# Patient Record
Sex: Female | Born: 1950 | Race: White | Hispanic: No | Smoking: Never smoker
Health system: Southern US, Community
[De-identification: ages and names within clinical notes are randomized; demographics above are authoritative.]

## PROBLEM LIST (undated history)

## (undated) DIAGNOSIS — F419 Anxiety disorder, unspecified: Secondary | ICD-10-CM

## (undated) DIAGNOSIS — F329 Major depressive disorder, single episode, unspecified: Secondary | ICD-10-CM

## (undated) DIAGNOSIS — I10 Essential (primary) hypertension: Secondary | ICD-10-CM

## (undated) DIAGNOSIS — F32A Depression, unspecified: Secondary | ICD-10-CM

## (undated) DIAGNOSIS — M1990 Unspecified osteoarthritis, unspecified site: Secondary | ICD-10-CM

## (undated) DIAGNOSIS — R569 Unspecified convulsions: Secondary | ICD-10-CM

## (undated) HISTORY — PX: WRIST GANGLION EXCISION: SUR520

## (undated) HISTORY — PX: TONSILLECTOMY: SUR1361

## (undated) HISTORY — PX: EYE SURGERY: SHX253

## (undated) HISTORY — PX: COLONOSCOPY: SHX174

---

## 1997-07-31 ENCOUNTER — Ambulatory Visit (HOSPITAL_COMMUNITY): Admission: RE | Admit: 1997-07-31 | Discharge: 1997-07-31 | Payer: Self-pay | Admitting: Obstetrics and Gynecology

## 1999-03-05 ENCOUNTER — Emergency Department (HOSPITAL_COMMUNITY): Admission: EM | Admit: 1999-03-05 | Discharge: 1999-03-05 | Payer: Self-pay | Admitting: Emergency Medicine

## 1999-03-05 ENCOUNTER — Encounter: Payer: Self-pay | Admitting: Emergency Medicine

## 1999-10-06 ENCOUNTER — Other Ambulatory Visit: Admission: RE | Admit: 1999-10-06 | Discharge: 1999-10-06 | Payer: Self-pay | Admitting: Obstetrics and Gynecology

## 1999-11-26 ENCOUNTER — Other Ambulatory Visit: Admission: RE | Admit: 1999-11-26 | Discharge: 1999-11-26 | Payer: Self-pay | Admitting: Obstetrics and Gynecology

## 2002-06-22 ENCOUNTER — Other Ambulatory Visit: Admission: RE | Admit: 2002-06-22 | Discharge: 2002-06-22 | Payer: Self-pay | Admitting: Obstetrics and Gynecology

## 2003-08-16 ENCOUNTER — Other Ambulatory Visit: Admission: RE | Admit: 2003-08-16 | Discharge: 2003-08-16 | Payer: Self-pay | Admitting: Obstetrics and Gynecology

## 2003-09-21 ENCOUNTER — Emergency Department (HOSPITAL_COMMUNITY): Admission: EM | Admit: 2003-09-21 | Discharge: 2003-09-21 | Payer: Self-pay | Admitting: Emergency Medicine

## 2006-04-08 ENCOUNTER — Ambulatory Visit: Payer: Self-pay | Admitting: Family Medicine

## 2006-04-29 ENCOUNTER — Ambulatory Visit: Payer: Self-pay | Admitting: Cardiology

## 2006-05-10 ENCOUNTER — Ambulatory Visit: Payer: Self-pay | Admitting: Cardiovascular Disease

## 2006-05-20 ENCOUNTER — Ambulatory Visit: Payer: Self-pay | Admitting: Cardiovascular Disease

## 2006-05-20 ENCOUNTER — Ambulatory Visit: Payer: Self-pay

## 2010-11-24 ENCOUNTER — Other Ambulatory Visit: Payer: Self-pay | Admitting: Neurological Surgery

## 2010-11-24 DIAGNOSIS — M479 Spondylosis, unspecified: Secondary | ICD-10-CM

## 2010-11-30 ENCOUNTER — Ambulatory Visit
Admission: RE | Admit: 2010-11-30 | Discharge: 2010-11-30 | Disposition: A | Discharge status: Home / Self Care | Payer: BC Managed Care – PPO | Source: Ambulatory Visit | Attending: Neurological Surgery | Admitting: Neurological Surgery

## 2010-11-30 DIAGNOSIS — M479 Spondylosis, unspecified: Secondary | ICD-10-CM

## 2011-05-03 ENCOUNTER — Other Ambulatory Visit (HOSPITAL_COMMUNITY): Payer: Self-pay | Admitting: Obstetrics and Gynecology

## 2011-05-03 DIAGNOSIS — R1900 Intra-abdominal and pelvic swelling, mass and lump, unspecified site: Secondary | ICD-10-CM

## 2011-05-05 ENCOUNTER — Ambulatory Visit (HOSPITAL_COMMUNITY): Payer: BC Managed Care – PPO

## 2011-05-07 ENCOUNTER — Ambulatory Visit
Admission: RE | Admit: 2011-05-07 | Discharge: 2011-05-07 | Disposition: A | Discharge status: Home / Self Care | Payer: No Typology Code available for payment source | Source: Ambulatory Visit | Attending: Obstetrics and Gynecology | Admitting: Obstetrics and Gynecology

## 2011-05-07 DIAGNOSIS — R1900 Intra-abdominal and pelvic swelling, mass and lump, unspecified site: Secondary | ICD-10-CM

## 2011-05-07 MED ORDER — IOHEXOL 300 MG/ML  SOLN
100.0000 mL | Freq: Once | INTRAMUSCULAR | Status: AC | PRN
  Administered 2011-05-07: 100 mL via INTRAVENOUS

## 2012-08-31 ENCOUNTER — Other Ambulatory Visit: Payer: Self-pay | Admitting: Family Medicine

## 2012-08-31 DIAGNOSIS — Z1231 Encounter for screening mammogram for malignant neoplasm of breast: Secondary | ICD-10-CM

## 2012-09-15 ENCOUNTER — Ambulatory Visit: Payer: Self-pay

## 2012-09-26 ENCOUNTER — Ambulatory Visit: Payer: Self-pay

## 2013-02-02 ENCOUNTER — Ambulatory Visit (INDEPENDENT_AMBULATORY_CARE_PROVIDER_SITE_OTHER): Payer: BC Managed Care – PPO

## 2013-02-02 DIAGNOSIS — R209 Unspecified disturbances of skin sensation: Secondary | ICD-10-CM

## 2013-02-02 NOTE — Procedures (Signed)
  HISTORY:  Danielle Ford is a 62 year old patient with a history of tingling sensations in the fifth and fourth fingers of the right hand, and difficulty with dexterity of the right hand. The patient is being evaluated for possible neuropathy.  NERVE CONDUCTION STUDIES:  Nerve conduction studies were performed on both upper extremities. The distal motor latencies and motor amplitudes for the median and ulnar nerves were within normal limits. The F wave latencies and nerve conduction velocities for these nerves were also normal. The sensory latencies for the median and ulnar nerves were normal.   EMG STUDIES:  EMG evaluation was not performed.  IMPRESSION:  Nerve conduction studies done on both upper extremities were within normal limits. No evidence of an ulnar neuropathy or carpal tunnel syndrome is seen on either side. If a cervical radiculopathy is clinically suspected, we recommend EMG evaluation of the affected extremity.  Marlan Palau MD 02/02/2013 1:04 PM  [facility] [address] [place], [place] [address]  Phone [phone] Fax [phone]

## 2013-04-04 ENCOUNTER — Other Ambulatory Visit: Payer: Self-pay | Admitting: Neurological Surgery

## 2013-04-04 DIAGNOSIS — M47812 Spondylosis without myelopathy or radiculopathy, cervical region: Secondary | ICD-10-CM

## 2013-04-04 DIAGNOSIS — G2589 Other specified extrapyramidal and movement disorders: Secondary | ICD-10-CM

## 2013-04-27 ENCOUNTER — Other Ambulatory Visit: Payer: Self-pay

## 2013-05-10 ENCOUNTER — Ambulatory Visit
Admission: RE | Admit: 2013-05-10 | Discharge: 2013-05-10 | Disposition: A | Discharge status: Home / Self Care | Payer: Self-pay | Source: Ambulatory Visit | Attending: Neurological Surgery | Admitting: Neurological Surgery

## 2013-05-10 ENCOUNTER — Ambulatory Visit
Admission: RE | Admit: 2013-05-10 | Discharge: 2013-05-10 | Disposition: A | Discharge status: Home / Self Care | Payer: BC Managed Care – PPO | Source: Ambulatory Visit | Attending: Neurological Surgery | Admitting: Neurological Surgery

## 2013-05-10 DIAGNOSIS — M47812 Spondylosis without myelopathy or radiculopathy, cervical region: Secondary | ICD-10-CM

## 2013-05-10 DIAGNOSIS — G2589 Other specified extrapyramidal and movement disorders: Secondary | ICD-10-CM

## 2013-05-10 MED ORDER — GADOBENATE DIMEGLUMINE 529 MG/ML IV SOLN
10.0000 mL | Freq: Once | INTRAVENOUS | Status: AC | PRN
  Administered 2013-05-10: 10 mL via INTRAVENOUS

## 2013-05-14 ENCOUNTER — Ambulatory Visit
Admission: RE | Admit: 2013-05-14 | Discharge: 2013-05-14 | Disposition: A | Discharge status: Home / Self Care | Payer: BC Managed Care – PPO | Source: Ambulatory Visit | Attending: Neurological Surgery | Admitting: Neurological Surgery

## 2013-05-14 ENCOUNTER — Other Ambulatory Visit: Payer: Self-pay | Admitting: Neurological Surgery

## 2013-05-14 DIAGNOSIS — I82401 Acute embolism and thrombosis of unspecified deep veins of right lower extremity: Secondary | ICD-10-CM

## 2014-04-12 ENCOUNTER — Other Ambulatory Visit: Payer: Self-pay | Admitting: Neurological Surgery

## 2014-04-12 DIAGNOSIS — M48061 Spinal stenosis, lumbar region without neurogenic claudication: Secondary | ICD-10-CM

## 2014-04-26 ENCOUNTER — Ambulatory Visit
Admission: RE | Admit: 2014-04-26 | Discharge: 2014-04-26 | Disposition: A | Discharge status: Home / Self Care | Payer: BC Managed Care – PPO | Source: Ambulatory Visit | Attending: Neurological Surgery | Admitting: Neurological Surgery

## 2014-04-26 DIAGNOSIS — M48061 Spinal stenosis, lumbar region without neurogenic claudication: Secondary | ICD-10-CM

## 2014-09-05 ENCOUNTER — Other Ambulatory Visit: Payer: Self-pay | Admitting: Neurological Surgery

## 2014-09-24 ENCOUNTER — Encounter (HOSPITAL_COMMUNITY)
Admission: RE | Admit: 2014-09-24 | Discharge: 2014-09-24 | Disposition: A | Discharge status: Home / Self Care | Payer: 59 | Source: Ambulatory Visit | Attending: Neurological Surgery | Admitting: Neurological Surgery

## 2014-09-24 ENCOUNTER — Encounter (HOSPITAL_COMMUNITY): Payer: Self-pay

## 2014-09-24 ENCOUNTER — Other Ambulatory Visit (HOSPITAL_COMMUNITY): Payer: Self-pay | Admitting: *Deleted

## 2014-09-24 DIAGNOSIS — Z01812 Encounter for preprocedural laboratory examination: Secondary | ICD-10-CM | POA: Insufficient documentation

## 2014-09-24 DIAGNOSIS — Z01810 Encounter for preprocedural cardiovascular examination: Secondary | ICD-10-CM | POA: Insufficient documentation

## 2014-09-24 HISTORY — DX: Major depressive disorder, single episode, unspecified: F32.9

## 2014-09-24 HISTORY — DX: Unspecified osteoarthritis, unspecified site: M19.90

## 2014-09-24 HISTORY — DX: Depression, unspecified: F32.A

## 2014-09-24 HISTORY — DX: Anxiety disorder, unspecified: F41.9

## 2014-09-24 HISTORY — DX: Unspecified convulsions: R56.9

## 2014-09-24 HISTORY — DX: Essential (primary) hypertension: I10

## 2014-09-24 LAB — CBC
HEMATOCRIT: 41 % (ref 36.0–46.0)
Hemoglobin: 13.5 g/dL (ref 12.0–15.0)
MCH: 32 pg (ref 26.0–34.0)
MCHC: 32.9 g/dL (ref 30.0–36.0)
MCV: 97.2 fL (ref 78.0–100.0)
PLATELETS: 234 10*3/uL (ref 150–400)
RBC: 4.22 MIL/uL (ref 3.87–5.11)
RDW: 13.4 % (ref 11.5–15.5)
WBC: 5.5 10*3/uL (ref 4.0–10.5)

## 2014-09-24 LAB — SURGICAL PCR SCREEN
MRSA, PCR: NEGATIVE
Staphylococcus aureus: NEGATIVE

## 2014-09-24 LAB — BASIC METABOLIC PANEL
Anion gap: 4 — ABNORMAL LOW (ref 5–15)
BUN: 16 mg/dL (ref 6–23)
CALCIUM: 10.2 mg/dL (ref 8.4–10.5)
CO2: 33 mmol/L — ABNORMAL HIGH (ref 19–32)
Chloride: 103 mmol/L (ref 96–112)
Creatinine, Ser: 0.87 mg/dL (ref 0.50–1.10)
GFR, EST AFRICAN AMERICAN: 80 mL/min — AB (ref >90)
GFR, EST NON AFRICAN AMERICAN: 69 mL/min — AB (ref >90)
GLUCOSE: 99 mg/dL (ref 70–99)
Potassium: 4.4 mmol/L (ref 3.5–5.1)
Sodium: 140 mmol/L (ref 135–145)

## 2014-09-24 NOTE — Progress Notes (Signed)
PCP is Tracey Harriesavid Bouska and patient stated her Cardiologist is Tonny BollmanMichael Cooper, however she had not seen him in approximately 5 years. Patient denied having any acute cardiac or pulmonary issues.   Patient informed Nurse that she last had a seizure in 2007, but has not had any thereafter.  Patient did not have any medications in EPIC, nor was patient aware of medication dosages. Pharmacy tech paged and came to PAT office and stated she would follow up with patients Pharmacy ([facility]).

## 2014-09-24 NOTE — Pre-Procedure Instructions (Signed)
Danielle GlassingKathy A Ford  09/24/2014   Your procedure is scheduled on:  Monday, October 07, 2014 at 7:30 AM.   Report to [facility] Entrance "A" Admitting Office at 5:30 AM.   Call this number if you have problems the morning of surgery: [phone]               Any questions prior to day of surgery, please call [phone] between 8 & 4 PM.   Remember:   Do not eat food or drink liquids after midnight Sunday, 10/06/14.   Take these medicines the morning of surgery with A SIP OF WATER: Lamotrigine (Lamictal), Fluoxetine, Allegra   Please stop taking any vitamins, herbal supplements, Advil, Motrin, Alleve, etc on Monday April 11th   Do not wear jewelry, make-up or nail polish.  Do not wear lotions, powders, or perfumes  Do not bring valuables to the hospital.  [facility] is not responsible for any belongings or valuables.               Contacts, dentures or bridgework may not be worn into surgery.  Leave suitcase in the car. After surgery it may be brought to your room.  For patients admitted to the hospital, discharge time is determined by your treatment team.               Special Instructions: See "Preparing for Surgery" Instruction sheet.    Please read over the following fact sheets that you were given: Pain Booklet, Coughing and Deep Breathing, MRSA Information and Surgical Site Infection Prevention

## 2014-09-24 NOTE — Pre-Procedure Instructions (Signed)
Ferd GlassingKathy A Brunker  09/24/2014   Your procedure is scheduled on:  Monday, October 07, 2014 at 7:30 AM.   Report [facility] Entrance "A" Admitting Office at 5:30 AM.   Call this number if you have problems the morning of surgery: [phone]               Any questions prior to day of surgery, please call [phone] between 8 & 4 PM.   Remember:   Do not eat food or drink liquids after midnight Sunday, 10/06/14.   Take these medicines the morning of surgery with A SIP OF WATER:    Do not wear jewelry, make-up or nail polish.  Do not wear lotions, powders, or perfumes  Do not bring valuables to the hospital.  [facility] is not responsible                  for any belongings or valuables.               Contacts, dentures or bridgework may not be worn into surgery.  Leave suitcase in the car. After surgery it may be brought to your room.  For patients admitted to the hospital, discharge time is determined by your                treatment team.               Special Instructions: See "Preparing for Surgery" Instruction sheet.    Please read over the following fact sheets that you were given: Pain Booklet, Coughing and Deep Breathing, MRSA Information and Surgical Site Infection Prevention

## 2014-09-27 ENCOUNTER — Other Ambulatory Visit (HOSPITAL_COMMUNITY): Payer: Self-pay

## 2014-10-06 MED ORDER — CEFAZOLIN SODIUM-DEXTROSE 2-3 GM-% IV SOLR
2.0000 g | INTRAVENOUS | Status: AC
  Administered 2014-10-07: 2 g via INTRAVENOUS
  Filled 2014-10-06: qty 50

## 2014-10-07 ENCOUNTER — Ambulatory Visit (HOSPITAL_COMMUNITY): Payer: 59 | Admitting: Anesthesiology

## 2014-10-07 ENCOUNTER — Encounter (HOSPITAL_COMMUNITY)
Admission: RE | Disposition: A | Discharge status: Home / Self Care | Payer: Self-pay | Source: Ambulatory Visit | Attending: Neurological Surgery

## 2014-10-07 ENCOUNTER — Ambulatory Visit (HOSPITAL_COMMUNITY): Payer: 59

## 2014-10-07 ENCOUNTER — Encounter (HOSPITAL_COMMUNITY): Payer: Self-pay

## 2014-10-07 ENCOUNTER — Observation Stay (HOSPITAL_COMMUNITY)
Admission: RE | Admit: 2014-10-07 | Discharge: 2014-10-08 | Disposition: A | Discharge status: Home / Self Care | Payer: 59 | Source: Ambulatory Visit | Attending: Neurological Surgery | Admitting: Neurological Surgery

## 2014-10-07 DIAGNOSIS — F329 Major depressive disorder, single episode, unspecified: Secondary | ICD-10-CM | POA: Insufficient documentation

## 2014-10-07 DIAGNOSIS — M4726 Other spondylosis with radiculopathy, lumbar region: Secondary | ICD-10-CM | POA: Diagnosis present

## 2014-10-07 DIAGNOSIS — M1990 Unspecified osteoarthritis, unspecified site: Secondary | ICD-10-CM | POA: Insufficient documentation

## 2014-10-07 DIAGNOSIS — I10 Essential (primary) hypertension: Secondary | ICD-10-CM | POA: Diagnosis not present

## 2014-10-07 DIAGNOSIS — M5416 Radiculopathy, lumbar region: Secondary | ICD-10-CM | POA: Diagnosis present

## 2014-10-07 DIAGNOSIS — Z419 Encounter for procedure for purposes other than remedying health state, unspecified: Secondary | ICD-10-CM

## 2014-10-07 HISTORY — PX: LUMBAR LAMINECTOMY/DECOMPRESSION MICRODISCECTOMY: SHX5026

## 2014-10-07 SURGERY — LUMBAR LAMINECTOMY/DECOMPRESSION MICRODISCECTOMY 1 LEVEL
Anesthesia: General | Site: Spine Lumbar

## 2014-10-07 MED ORDER — PROMETHAZINE HCL 25 MG/ML IJ SOLN: 6.2500 mg | INTRAMUSCULAR | Status: DC | PRN

## 2014-10-07 MED ORDER — LIDOCAINE HCL (CARDIAC) 20 MG/ML IV SOLN
INTRAVENOUS | Status: AC
  Filled 2014-10-07: qty 5

## 2014-10-07 MED ORDER — PHENYLEPHRINE 40 MCG/ML (10ML) SYRINGE FOR IV PUSH (FOR BLOOD PRESSURE SUPPORT)
PREFILLED_SYRINGE | INTRAVENOUS | Status: AC
  Filled 2014-10-07: qty 10

## 2014-10-07 MED ORDER — HYDROMORPHONE HCL 1 MG/ML IJ SOLN
0.2500 mg | INTRAMUSCULAR | Status: DC | PRN
  Administered 2014-10-07 (×2): 0.5 mg via INTRAVENOUS

## 2014-10-07 MED ORDER — MENTHOL 3 MG MT LOZG: 1.0000 | LOZENGE | OROMUCOSAL | Status: DC | PRN

## 2014-10-07 MED ORDER — LACTATED RINGERS IV SOLN
INTRAVENOUS | Status: DC | PRN
  Administered 2014-10-07 (×2): via INTRAVENOUS

## 2014-10-07 MED ORDER — PROPOFOL 10 MG/ML IV BOLUS
INTRAVENOUS | Status: AC
  Filled 2014-10-07: qty 20

## 2014-10-07 MED ORDER — ONDANSETRON HCL 4 MG/2ML IJ SOLN
INTRAMUSCULAR | Status: DC | PRN
  Administered 2014-10-07: 4 mg via INTRAVENOUS

## 2014-10-07 MED ORDER — ROCURONIUM BROMIDE 100 MG/10ML IV SOLN
INTRAVENOUS | Status: DC | PRN
  Administered 2014-10-07: 40 mg via INTRAVENOUS

## 2014-10-07 MED ORDER — DEXAMETHASONE SODIUM PHOSPHATE 10 MG/ML IJ SOLN
INTRAMUSCULAR | Status: DC | PRN
  Administered 2014-10-07: 10 mg via INTRAVENOUS

## 2014-10-07 MED ORDER — FLUOXETINE HCL 20 MG PO CAPS
20.0000 mg | ORAL_CAPSULE | Freq: Three times a day (TID) | ORAL | Status: DC
  Administered 2014-10-07 (×3): 20 mg via ORAL
  Filled 2014-10-07 (×6): qty 1

## 2014-10-07 MED ORDER — LAMOTRIGINE 200 MG PO TABS
200.0000 mg | ORAL_TABLET | Freq: Two times a day (BID) | ORAL | Status: DC
  Administered 2014-10-07: 200 mg via ORAL
  Filled 2014-10-07 (×4): qty 1

## 2014-10-07 MED ORDER — AMPHETAMINE-DEXTROAMPHETAMINE 10 MG PO TABS
30.0000 mg | ORAL_TABLET | Freq: Two times a day (BID) | ORAL | Status: DC
  Filled 2014-10-07: qty 3

## 2014-10-07 MED ORDER — DIPHENHYDRAMINE HCL 25 MG PO CAPS
25.0000 mg | ORAL_CAPSULE | Freq: Four times a day (QID) | ORAL | Status: DC | PRN
  Administered 2014-10-07: 25 mg via ORAL
  Filled 2014-10-07: qty 1

## 2014-10-07 MED ORDER — NEOSTIGMINE METHYLSULFATE 10 MG/10ML IV SOLN
INTRAVENOUS | Status: DC | PRN
  Administered 2014-10-07: 3 mg via INTRAVENOUS

## 2014-10-07 MED ORDER — SODIUM CHLORIDE 0.9 % IR SOLN
Status: DC | PRN
  Administered 2014-10-07: 500 mL

## 2014-10-07 MED ORDER — ALUM & MAG HYDROXIDE-SIMETH 200-200-20 MG/5ML PO SUSP: 30.0000 mL | Freq: Four times a day (QID) | ORAL | Status: DC | PRN

## 2014-10-07 MED ORDER — HEMOSTATIC AGENTS (NO CHARGE) OPTIME
TOPICAL | Status: DC | PRN
  Administered 2014-10-07: 1 via TOPICAL

## 2014-10-07 MED ORDER — OXYCODONE HCL 5 MG PO TABS
ORAL_TABLET | ORAL | Status: AC
  Filled 2014-10-07: qty 1

## 2014-10-07 MED ORDER — MIDAZOLAM HCL 2 MG/2ML IJ SOLN
INTRAMUSCULAR | Status: AC
  Filled 2014-10-07: qty 2

## 2014-10-07 MED ORDER — DOCUSATE SODIUM 100 MG PO CAPS
100.0000 mg | ORAL_CAPSULE | Freq: Two times a day (BID) | ORAL | Status: DC
  Administered 2014-10-07: 100 mg via ORAL
  Filled 2014-10-07 (×2): qty 1

## 2014-10-07 MED ORDER — SUCCINYLCHOLINE CHLORIDE 20 MG/ML IJ SOLN
INTRAMUSCULAR | Status: AC
Start: 2014-10-07 — End: 2014-10-07
  Filled 2014-10-07: qty 1

## 2014-10-07 MED ORDER — OXYCODONE HCL 5 MG PO TABS
5.0000 mg | ORAL_TABLET | Freq: Three times a day (TID) | ORAL | Status: DC
Start: 2014-10-07 — End: 2014-10-08
  Filled 2014-10-07 (×3): qty 1

## 2014-10-07 MED ORDER — BUPIVACAINE HCL (PF) 0.5 % IJ SOLN
INTRAMUSCULAR | Status: DC | PRN
  Administered 2014-10-07: 20 mL
  Administered 2014-10-07: 5 mL

## 2014-10-07 MED ORDER — LIDOCAINE HCL (CARDIAC) 20 MG/ML IV SOLN
INTRAVENOUS | Status: DC | PRN
  Administered 2014-10-07: 40 mg via INTRATRACHEAL
  Administered 2014-10-07: 60 mg via INTRAVENOUS

## 2014-10-07 MED ORDER — ACETAMINOPHEN 650 MG RE SUPP: 650.0000 mg | RECTAL | Status: DC | PRN

## 2014-10-07 MED ORDER — DEXAMETHASONE SODIUM PHOSPHATE 10 MG/ML IJ SOLN
INTRAMUSCULAR | Status: AC
  Filled 2014-10-07: qty 1

## 2014-10-07 MED ORDER — LIDOCAINE-EPINEPHRINE 1 %-1:100000 IJ SOLN
INTRAMUSCULAR | Status: DC | PRN
  Administered 2014-10-07: 5 mL

## 2014-10-07 MED ORDER — ARTIFICIAL TEARS OP OINT
TOPICAL_OINTMENT | OPHTHALMIC | Status: AC
  Filled 2014-10-07: qty 3.5

## 2014-10-07 MED ORDER — LIDOCAINE HCL (CARDIAC) 20 MG/ML IV SOLN
INTRAVENOUS | Status: AC
Start: 2014-10-07 — End: 2014-10-07
  Filled 2014-10-07: qty 5

## 2014-10-07 MED ORDER — NEOSTIGMINE METHYLSULFATE 10 MG/10ML IV SOLN
INTRAVENOUS | Status: AC
  Filled 2014-10-07: qty 1

## 2014-10-07 MED ORDER — FENTANYL CITRATE (PF) 100 MCG/2ML IJ SOLN
INTRAMUSCULAR | Status: DC | PRN
  Administered 2014-10-07: 50 ug via INTRAVENOUS
  Administered 2014-10-07: 100 ug via INTRAVENOUS

## 2014-10-07 MED ORDER — DEXTROSE 5 % IV SOLN: 500.0000 mg | Freq: Four times a day (QID) | INTRAVENOUS | Status: DC | PRN

## 2014-10-07 MED ORDER — OXYCODONE HCL 5 MG PO TABS
5.0000 mg | ORAL_TABLET | Freq: Once | ORAL | Status: AC | PRN
  Administered 2014-10-07: 5 mg via ORAL

## 2014-10-07 MED ORDER — FENTANYL CITRATE (PF) 250 MCG/5ML IJ SOLN
INTRAMUSCULAR | Status: AC
  Filled 2014-10-07: qty 5

## 2014-10-07 MED ORDER — 0.9 % SODIUM CHLORIDE (POUR BTL) OPTIME
TOPICAL | Status: DC | PRN
  Administered 2014-10-07: 1000 mL

## 2014-10-07 MED ORDER — SODIUM CHLORIDE 0.9 % IV SOLN: 250.0000 mL | INTRAVENOUS | Status: DC

## 2014-10-07 MED ORDER — HYDROMORPHONE HCL 1 MG/ML IJ SOLN
0.5000 mg | INTRAMUSCULAR | Status: DC | PRN
  Administered 2014-10-07 (×2): 1 mg via INTRAVENOUS
  Filled 2014-10-07 (×3): qty 1

## 2014-10-07 MED ORDER — EPHEDRINE SULFATE 50 MG/ML IJ SOLN
INTRAMUSCULAR | Status: AC
Start: 2014-10-07 — End: 2014-10-07
  Filled 2014-10-07: qty 1

## 2014-10-07 MED ORDER — POLYETHYLENE GLYCOL 3350 17 G PO PACK
17.0000 g | PACK | Freq: Every day | ORAL | Status: DC | PRN
  Filled 2014-10-07: qty 1

## 2014-10-07 MED ORDER — METHOCARBAMOL 500 MG PO TABS
500.0000 mg | ORAL_TABLET | Freq: Four times a day (QID) | ORAL | Status: DC | PRN
  Administered 2014-10-08: 500 mg via ORAL
  Filled 2014-10-07: qty 1

## 2014-10-07 MED ORDER — SODIUM CHLORIDE 0.9 % IJ SOLN
INTRAMUSCULAR | Status: AC
  Filled 2014-10-07: qty 10

## 2014-10-07 MED ORDER — AMPHETAMINE-DEXTROAMPHETAMINE 30 MG PO TABS: 30.0000 mg | ORAL_TABLET | Freq: Two times a day (BID) | ORAL | Status: DC

## 2014-10-07 MED ORDER — ACETAMINOPHEN 325 MG PO TABS: 650.0000 mg | ORAL_TABLET | ORAL | Status: DC | PRN

## 2014-10-07 MED ORDER — OXYCODONE-ACETAMINOPHEN 5-325 MG PO TABS: 1.0000 | ORAL_TABLET | ORAL | Status: DC | PRN

## 2014-10-07 MED ORDER — GLYCOPYRROLATE 0.2 MG/ML IJ SOLN
INTRAMUSCULAR | Status: AC
  Filled 2014-10-07: qty 1

## 2014-10-07 MED ORDER — MIDAZOLAM HCL 5 MG/5ML IJ SOLN
INTRAMUSCULAR | Status: DC | PRN
  Administered 2014-10-07: 2 mg via INTRAVENOUS

## 2014-10-07 MED ORDER — HYDROMORPHONE HCL 1 MG/ML IJ SOLN
INTRAMUSCULAR | Status: AC
  Filled 2014-10-07: qty 1

## 2014-10-07 MED ORDER — LORATADINE 10 MG PO TABS
10.0000 mg | ORAL_TABLET | Freq: Every day | ORAL | Status: DC
  Filled 2014-10-07: qty 1

## 2014-10-07 MED ORDER — BISACODYL 10 MG RE SUPP: 10.0000 mg | Freq: Every day | RECTAL | Status: DC | PRN

## 2014-10-07 MED ORDER — GLYCOPYRROLATE 0.2 MG/ML IJ SOLN
INTRAMUSCULAR | Status: DC | PRN
  Administered 2014-10-07: 0.4 mg via INTRAVENOUS

## 2014-10-07 MED ORDER — SODIUM CHLORIDE 0.9 % IJ SOLN: 3.0000 mL | INTRAMUSCULAR | Status: DC | PRN

## 2014-10-07 MED ORDER — GLYCOPYRROLATE 0.2 MG/ML IJ SOLN
INTRAMUSCULAR | Status: AC
  Filled 2014-10-07: qty 2

## 2014-10-07 MED ORDER — SODIUM CHLORIDE 0.9 % IJ SOLN
3.0000 mL | Freq: Two times a day (BID) | INTRAMUSCULAR | Status: DC
  Administered 2014-10-07: 3 mL via INTRAVENOUS

## 2014-10-07 MED ORDER — ARTIFICIAL TEARS OP OINT
TOPICAL_OINTMENT | OPHTHALMIC | Status: DC | PRN
  Administered 2014-10-07: 1 via OPHTHALMIC

## 2014-10-07 MED ORDER — TRAZODONE HCL 100 MG PO TABS
100.0000 mg | ORAL_TABLET | Freq: Every day | ORAL | Status: DC
  Administered 2014-10-07: 100 mg via ORAL
  Filled 2014-10-07 (×2): qty 1

## 2014-10-07 MED ORDER — KETOROLAC TROMETHAMINE 15 MG/ML IJ SOLN
15.0000 mg | Freq: Four times a day (QID) | INTRAMUSCULAR | Status: DC
  Administered 2014-10-07 – 2014-10-08 (×4): 15 mg via INTRAVENOUS
  Filled 2014-10-07 (×5): qty 1

## 2014-10-07 MED ORDER — SENNA 8.6 MG PO TABS
1.0000 | ORAL_TABLET | Freq: Two times a day (BID) | ORAL | Status: DC
  Administered 2014-10-07: 8.6 mg via ORAL
  Filled 2014-10-07 (×3): qty 1

## 2014-10-07 MED ORDER — PHENOL 1.4 % MT LIQD: 1.0000 | OROMUCOSAL | Status: DC | PRN

## 2014-10-07 MED ORDER — ONDANSETRON HCL 4 MG/2ML IJ SOLN
INTRAMUSCULAR | Status: AC
  Filled 2014-10-07: qty 2

## 2014-10-07 MED ORDER — OXYCODONE HCL 5 MG PO TABS
5.0000 mg | ORAL_TABLET | ORAL | Status: DC | PRN
  Administered 2014-10-08 (×2): 10 mg via ORAL
  Filled 2014-10-07 (×2): qty 2

## 2014-10-07 MED ORDER — MONTELUKAST SODIUM 10 MG PO TABS
10.0000 mg | ORAL_TABLET | Freq: Every day | ORAL | Status: DC
  Filled 2014-10-07: qty 1

## 2014-10-07 MED ORDER — ONDANSETRON HCL 4 MG/2ML IJ SOLN: 4.0000 mg | INTRAMUSCULAR | Status: DC | PRN

## 2014-10-07 MED ORDER — OXYCODONE HCL 5 MG/5ML PO SOLN: 5.0000 mg | Freq: Once | ORAL | Status: AC | PRN

## 2014-10-07 MED ORDER — PROPOFOL 10 MG/ML IV BOLUS
INTRAVENOUS | Status: DC | PRN
  Administered 2014-10-07: 150 mg via INTRAVENOUS

## 2014-10-07 MED ORDER — LISINOPRIL 5 MG PO TABS
5.0000 mg | ORAL_TABLET | Freq: Every day | ORAL | Status: DC
  Administered 2014-10-07: 5 mg via ORAL
  Filled 2014-10-07 (×2): qty 1

## 2014-10-07 MED ORDER — THROMBIN 5000 UNITS EX SOLR
CUTANEOUS | Status: DC | PRN
  Administered 2014-10-07 (×2): 5000 [IU] via TOPICAL

## 2014-10-07 MED ORDER — ROCURONIUM BROMIDE 50 MG/5ML IV SOLN
INTRAVENOUS | Status: AC
  Filled 2014-10-07: qty 1

## 2014-10-07 SURGICAL SUPPLY — 57 items
ADH SKN CLS LQ APL DERMABOND (GAUZE/BANDAGES/DRESSINGS) ×1
BAG DECANTER FOR FLEXI CONT (MISCELLANEOUS) ×3 IMPLANT
BLADE CLIPPER SURG (BLADE) IMPLANT
BUR ACORN 6.0 (BURR) IMPLANT
BUR ACORN 6.0MM (BURR)
BUR MATCHSTICK NEURO 3.0 LAGG (BURR) ×3 IMPLANT
CANISTER SUCT 3000ML PPV (MISCELLANEOUS) ×3 IMPLANT
CONT SPEC 4OZ CLIKSEAL STRL BL (MISCELLANEOUS) ×3 IMPLANT
DECANTER SPIKE VIAL GLASS SM (MISCELLANEOUS) ×3 IMPLANT
DERMABOND ADHESIVE PROPEN (GAUZE/BANDAGES/DRESSINGS) ×2
DERMABOND ADVANCED .7 DNX6 (GAUZE/BANDAGES/DRESSINGS) IMPLANT
DRAPE LAPAROTOMY T 102X78X121 (DRAPES) ×3 IMPLANT
DRAPE MICROSCOPE LEICA (MISCELLANEOUS) IMPLANT
DRAPE POUCH INSTRU U-SHP 10X18 (DRAPES) ×3 IMPLANT
DRAPE PROXIMA HALF (DRAPES) IMPLANT
DURAPREP 26ML APPLICATOR (WOUND CARE) ×3 IMPLANT
ELECT REM PT RETURN 9FT ADLT (ELECTROSURGICAL) ×3
ELECTRODE REM PT RTRN 9FT ADLT (ELECTROSURGICAL) ×1 IMPLANT
GAUZE SPONGE 4X4 12PLY STRL (GAUZE/BANDAGES/DRESSINGS) ×1 IMPLANT
GAUZE SPONGE 4X4 16PLY XRAY LF (GAUZE/BANDAGES/DRESSINGS) IMPLANT
GLOVE BIO SURGEON STRL SZ8 (GLOVE) ×2 IMPLANT
GLOVE BIO SURGEON STRL SZ8.5 (GLOVE) ×2 IMPLANT
GLOVE BIOGEL PI IND STRL 7.5 (GLOVE) IMPLANT
GLOVE BIOGEL PI IND STRL 8.5 (GLOVE) ×1 IMPLANT
GLOVE BIOGEL PI INDICATOR 7.5 (GLOVE) ×2
GLOVE BIOGEL PI INDICATOR 8.5 (GLOVE) ×2
GLOVE ECLIPSE 8.5 STRL (GLOVE) ×3 IMPLANT
GLOVE EXAM NITRILE LRG STRL (GLOVE) IMPLANT
GLOVE EXAM NITRILE MD LF STRL (GLOVE) IMPLANT
GLOVE EXAM NITRILE XL STR (GLOVE) IMPLANT
GLOVE EXAM NITRILE XS STR PU (GLOVE) IMPLANT
GLOVE SURG SS PI 7.0 STRL IVOR (GLOVE) ×4 IMPLANT
GOWN STRL REUS W/ TWL LRG LVL3 (GOWN DISPOSABLE) IMPLANT
GOWN STRL REUS W/ TWL XL LVL3 (GOWN DISPOSABLE) IMPLANT
GOWN STRL REUS W/TWL 2XL LVL3 (GOWN DISPOSABLE) ×3 IMPLANT
GOWN STRL REUS W/TWL LRG LVL3 (GOWN DISPOSABLE)
GOWN STRL REUS W/TWL XL LVL3 (GOWN DISPOSABLE) ×6
KIT BASIN OR (CUSTOM PROCEDURE TRAY) ×3 IMPLANT
KIT ROOM TURNOVER OR (KITS) ×3 IMPLANT
LIQUID BAND (GAUZE/BANDAGES/DRESSINGS) ×1 IMPLANT
NDL SPNL 20GX3.5 QUINCKE YW (NEEDLE) IMPLANT
NEEDLE HYPO 22GX1.5 SAFETY (NEEDLE) ×3 IMPLANT
NEEDLE SPNL 20GX3.5 QUINCKE YW (NEEDLE) ×3 IMPLANT
NS IRRIG 1000ML POUR BTL (IV SOLUTION) ×3 IMPLANT
PACK LAMINECTOMY NEURO (CUSTOM PROCEDURE TRAY) ×3 IMPLANT
PAD ARMBOARD 7.5X6 YLW CONV (MISCELLANEOUS) ×13 IMPLANT
PATTIES SURGICAL .5 X1 (DISPOSABLE) ×1 IMPLANT
RUBBERBAND STERILE (MISCELLANEOUS) IMPLANT
SPONGE SURGIFOAM ABS GEL SZ50 (HEMOSTASIS) ×3 IMPLANT
SUT VIC AB 1 CT1 18XBRD ANBCTR (SUTURE) ×1 IMPLANT
SUT VIC AB 1 CT1 8-18 (SUTURE) ×3
SUT VIC AB 2-0 CP2 18 (SUTURE) ×3 IMPLANT
SUT VIC AB 3-0 SH 8-18 (SUTURE) ×3 IMPLANT
SYR 20ML ECCENTRIC (SYRINGE) ×3 IMPLANT
TOWEL OR 17X24 6PK STRL BLUE (TOWEL DISPOSABLE) ×3 IMPLANT
TOWEL OR 17X26 10 PK STRL BLUE (TOWEL DISPOSABLE) ×3 IMPLANT
WATER STERILE IRR 1000ML POUR (IV SOLUTION) ×3 IMPLANT

## 2014-10-07 NOTE — Evaluation (Signed)
Physical Therapy Evaluation Patient Details Name: Danielle GlassingKathy A Clippard MRN: [id number] DOB: February 01, 1951 Today's Date: 10/07/2014   History of Present Illness  Patient is a 64 y/o female s/p L3-4 Laminectomy/Foraminotomy. PMH of HTN, depression, seizures, anxiety and arthritis.  Clinical Impression  Patient presents with generalized weakness and balance deficits impacting mobility. Tolerated ambulation with mild drifting most likely secondary to pain medications. Education provided on back precautions. Pt will have 24/7 S at home initially from friends/roommates. Would benefit from skilled PT to improve transfers, gait, balance and mobility so pt can maximize independence and return to PLOF.    Follow Up Recommendations No PT follow up;Supervision - Intermittent    Equipment Recommendations  None recommended by PT    Recommendations for Other Services       Precautions / Restrictions Precautions Precautions: Back Precaution Booklet Issued: Yes (comment) Precaution Comments: Reviewed precautions and handout. Restrictions Weight Bearing Restrictions: No      Mobility  Bed Mobility Overal bed mobility: Needs Assistance Bed Mobility: Rolling;Sidelying to Sit;Sit to Sidelying Rolling: Modified independent (Device/Increase time) Sidelying to sit: Modified independent (Device/Increase time);HOB elevated     Sit to sidelying: Modified independent (Device/Increase time);HOB elevated General bed mobility comments: Use of rail for support. Cues for log roll technique.  Transfers Overall transfer level: Modified independent Equipment used: None Transfers: Sit to/from Stand Sit to Stand: Modified independent (Device/Increase time)         General transfer comment: Stood from EOB x1, from toilet x1.   Ambulation/Gait Ambulation/Gait assistance: Supervision Ambulation Distance (Feet): 200 Feet Assistive device: None Gait Pattern/deviations: Step-through pattern;Decreased dorsiflexion -  right;Decreased dorsiflexion - left;Drifts right/left   Gait velocity interpretation: <1.8 ft/sec, indicative of risk for recurrent falls General Gait Details: Pt with mostly steady gait- mild drifting noted most likely due to pain medications and pt just waking up. No LOB. Decreased foot clearance Bil. Pt not wearing shoes (reports increased stability with shoes donned).  Stairs Stairs: Yes Stairs assistance: Supervision Stair Management: Alternating pattern;One rail Right Number of Stairs: 12 General stair comments: Supervision for safety. Despite cues for technique, pt utilizing step through pattern.  Wheelchair Mobility    Modified Rankin (Stroke Patients Only)       Balance Overall balance assessment: Needs assistance Sitting-balance support: Feet supported;No upper extremity supported Sitting balance-Leahy Scale: Good     Standing balance support: During functional activity Standing balance-Leahy Scale: Good                               Pertinent Vitals/Pain Pain Assessment: No/denies pain    Home Living Family/patient expects to be discharged to:: Private residence Living Arrangements: Non-relatives/Friends Available Help at Discharge: Friend(s);Available 24 hours/day Type of Home: House Home Access: Stairs to enter   [employer] Corporation[employer]trance Stairs-Number of Steps: 1 Home Layout: Multi-level Home Equipment: None      Prior Function Level of Independence: Independent         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     Hand Dominance        Extremity/Trunk Assessment   Upper Extremity Assessment: Defer to OT evaluation           Lower Extremity Assessment: RLE deficits/detail;LLE deficits/detail;Generalized weakness RLE Deficits / Details: Decreased DF noted during gait  LLE Deficits / Details: Decreased DF noted during gait  Cervical / Trunk Assessment: Normal  Communication   Communication: No difficulties  Cognition Arousal/Alertness:  Awake/alert Behavior During  Therapy: WFL for tasks assessed/performed Overall Cognitive Status: Within Functional Limits for tasks assessed                      General Comments General comments (skin integrity, edema, etc.): Instructed pt to change positions every 45 minutes.     Exercises        Assessment/Plan    PT Assessment Patient needs continued PT services  PT Diagnosis Difficulty walking   PT Problem List Decreased strength;Decreased balance;Decreased mobility;Impaired sensation;Decreased knowledge of precautions  PT Treatment Interventions Balance training;Gait training;Stair training;Patient/family education;Functional mobility training;Therapeutic activities;Therapeutic exercise   PT Goals (Current goals can be found in the Care Plan section) Acute Rehab PT Goals Patient Stated Goal: to return to independence PT Goal Formulation: With patient Time For Goal Achievement: 10/21/14 Potential to Achieve Goals: Good    Frequency Min 5X/week   Barriers to discharge        Co-evaluation               End of Session Equipment Utilized During Treatment: Gait belt Activity Tolerance: Patient tolerated treatment well Patient left: in bed;with call bell/phone within reach;with family/visitor present Nurse Communication: Mobility status    Functional Assessment Tool Used: Clinical judgment Functional Limitation: Mobility: Walking and moving around Mobility: Walking and Moving Around Current Status [phone]): At least 1 percent but less than 20 percent impaired, limited or restricted Mobility: Walking and Moving Around Goal Status [phone]): At least 1 percent but less than 20 percent impaired, limited or restricted    Time: 1525-1545 PT Time Calculation (min) (ACUTE ONLY): 20 min   Charges:   PT Evaluation $Initial PT Evaluation Tier I: 1 Procedure     PT G Codes:   PT G-Codes **NOT FOR INPATIENT CLASS** Functional Assessment Tool Used: Clinical  judgment Functional Limitation: Mobility: Walking and moving around Mobility: Walking and Moving Around Current Status ([id number]): At least 1 percent but less than 20 percent impaired, limited or restricted Mobility: Walking and Moving Around Goal Status [phone]): At least 1 percent but less than 20 percent impaired, limited or restricted    Alvie Heidelberg A 10/07/2014, 4:13 PM Alvie Heidelberg, PT, DPT [phone]

## 2014-10-07 NOTE — Anesthesia Postprocedure Evaluation (Signed)
  Anesthesia Post-op Note  Patient: Danielle Ford  Procedure(s) Performed: Procedure(s): Lumbar Three-Four  Laminectomy/Foraminotomy (N/A)  Patient Location: PACU  Anesthesia Type:General  Level of Consciousness: awake and alert   Airway and Oxygen Therapy: Patient Spontanous Breathing  Post-op Pain: mild  Post-op Assessment: Post-op Vital signs reviewed  Post-op Vital Signs: stable  Last Vitals:  Filed Vitals:   10/07/14 1058  BP: 116/56  Pulse: 71  Temp: 36.8 C  Resp: 16    Complications: No apparent anesthesia complications

## 2014-10-07 NOTE — Op Note (Signed)
Date of surgery: 10/07/2014 Preoperative diagnosis: Lumbar spondylosis, lumbar stenosis, lumbar radiculopathy, spondylolisthesis L3-L4 Postoperative diagnosis: Same Procedure: Bilateral laminotomies and foraminotomies L3-L4 with decompression of L3 and L4 nerve roots. Surgeon: Barnett AbuHenry Jann Milkovich First assistant: Tressie StalkerJeffrey Jenkins M.D. Anesthesia: Gen. endotracheal Indications: Patient is a 64 year old individual who's had significant and progressive back and leg pain the pain seems to be worse in the left leg than the right leg. An MRI at the end of November demonstrates the presence of severe spondylitic stenosis at L3-L4 with grade 1 spondylolisthesis. There are also milder changes at L4-5 and also at L5-S1. She is failed all manner of conservative effort and surgery has been recommended to decompress L3-L4.  The patient was brought to the operating room supine on a stretcher. After the smooth induction of general endotracheal anesthesia, she was turned prone. The back was prepped with alcohol and DuraPrep and draped in a sterile fashion. Midline incision was made in the lumbar dorsal fascia was opened on either side of L3-L4 which was localized positively with the radiograph. In the interlaminar space at L3-4 was cleared. Self-retaining retractor was placed into the wound. Dissection of the lamina was then performed and removal of the inferior margin lamina of L3 out to the medial wall the facet performing a partial facetectomy was performed. The old ligament was noted to be very thickened and redundant and this was lifted and removed. The common dural tube underlying this was examined. The dissection was carried out laterally inferiorly to decompress the L4 nerve root which had substantial subarticular stenosis. This was elevated with 2 mm Kerrison punch and nerve root was carefully protected  as it was decompressed. this was done bilaterally. Then in the superior aspect the L3 nerve root path was sounded and  there was noted to be substantial redundant thickened ligamentous material along with the superior aspect of the foot articular process of L4. This was resected partially and the foramen for the L4 nerve root was decompressed in this fashion. In the end the common dural tube both the L3 nerve roots in both the L4 nerve roots were well decompressed. The dura was inspected and noted to be intact. The underlying disc was noted to have a slight central bulge but the ligament itself was intact. With this hemostasis was achieved in the soft tissues, the retractor was removed, and then the wound was closed with #1 lumbar dorsal fascia. 2-0 Vicryl was used in the subcutaneous tissues. 20 mL of half percent Marcaine was injected into the paraspinous fascia. Subcuticular skin was closed with 3-0 Vicryl. Blood loss for the entire procedure was estimated at less than 50 mL.

## 2014-10-07 NOTE — Addendum Note (Signed)
Addendum  created 10/07/14 1256 by Lovie CholJennifer K Caspian Deleonardis, CRNA   Modules edited: Anesthesia Attestations

## 2014-10-07 NOTE — H&P (Signed)
Danielle Ford is an 64 y.o. female.   Chief Complaint: back an bilateral leg pain HPI: Patient is a 64 yo white female who has had progressively worsening back pain and leg dysfunction for several years. 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 Am Danielle LatchMri from December of 2015 demonstrates this best. Surgical decompression had been contemplated for some time and is now to be done with bilateral laminotomies and foraminotomies at L 3-4. It is also noted that she has mild stenosis at L4-5 and to an even lesser extent at L5S1.  Past Medical History  Diagnosis Date  . Hypertension   . Seizures   . Depression   . Anxiety   . Arthritis     Past Surgical History  Procedure Laterality Date  . Wrist ganglion excision Left   . Tonsillectomy      And adnoids  . Colonoscopy    . Eye surgery Bilateral     cataract surgery    No family history on file. Social History:  reports that she has never smoked. She does not have any smokeless tobacco history on file. She reports that she drinks about 1.8 oz of alcohol per week. She reports that she does not use illicit drugs.  Allergies: No Known Allergies  No prescriptions prior to admission    No results found for this or any previous visit (from the past 48 hour(s)). No results found.  Review of Systems  Constitutional: Negative.   HENT: Negative.   Eyes: Negative.   Respiratory: Negative.   Cardiovascular: Negative.   Gastrointestinal: Negative.   Genitourinary: Negative.   Musculoskeletal: Positive for back pain.  Skin: Negative.   Neurological: Positive for tingling and focal weakness.  Endo/Heme/Allergies: Negative.   Psychiatric/Behavioral: Negative.     There were no vitals taken for this visit. Physical Exam  Constitutional: She is  oriented to person, place, and time. She appears well-developed and well-nourished.  HENT:  Head: Normocephalic and atraumatic.  Eyes: Conjunctivae are normal. Pupils are equal, round, and reactive to light.  Neck: Normal range of motion. Neck supple.  Cardiovascular: Normal rate and regular rhythm.   Respiratory: Effort normal and breath sounds normal.  GI: Soft. Bowel sounds are normal.  Musculoskeletal:  Positive SLR at 30 degrees bilat  Neurological: She is alert and oriented to person, place, and time.  Mild tib ant weakness bilateraally  Skin: Skin is warm and dry.  Psychiatric: She has a normal mood and affect. Her behavior is normal. Judgment and thought content normal.     Assessment/Plan Spondylosis and Stenosis at L3-4.  Abad Manard J 10/07/2014, 2:26 AM

## 2014-10-07 NOTE — Transfer of Care (Signed)
Immediate Anesthesia Transfer of Care Note  Patient: Danielle Ford  Procedure(s) Performed: Procedure(s): Lumbar Three-Four  Laminectomy/Foraminotomy (N/A)  Patient Location: PACU  Anesthesia Type:General  Level of Consciousness: awake, oriented and patient cooperative  Airway & Oxygen Therapy: Patient Spontanous Breathing and Patient connected to nasal cannula oxygen  Post-op Assessment: Report given to RN and Post -op Vital signs reviewed and stable  Post vital signs: Reviewed  Last Vitals:  Filed Vitals:   10/07/14 0609  BP: 106/52  Pulse: 62  Temp: 36.5 C  Resp: 20    Complications: No apparent anesthesia complications

## 2014-10-07 NOTE — Anesthesia Preprocedure Evaluation (Addendum)
Anesthesia Evaluation  Patient identified by MRN, date of birth, ID band Patient awake    Reviewed: Allergy & Precautions, NPO status , Patient's Chart, lab work & pertinent test results  History of Anesthesia Complications Negative for: history of anesthetic complications  Airway Mallampati: II  TM Distance: >3 FB Neck ROM: Full  Mouth opening: Limited Mouth Opening Comment: cerv disease Dental  (+) Teeth Intact, Caps, Dental Advisory Given,    Pulmonary neg pulmonary ROS,  breath sounds clear to auscultation        Cardiovascular hypertension, Pt. on medications Rhythm:Regular Rate:Normal     Neuro/Psych Seizures -, Well Controlled,  PSYCHIATRIC DISORDERS Depression    GI/Hepatic negative GI ROS, Neg liver ROS,   Endo/Other  negative endocrine ROS  Renal/GU negative Renal ROS     Musculoskeletal  (+) Arthritis -,   Abdominal   Peds  Hematology   Anesthesia Other Findings C = capped teeth  Reproductive/Obstetrics negative OB ROS                           Anesthesia Physical Anesthesia Plan  ASA: II  Anesthesia Plan: General   Post-op Pain Management:    Induction: Intravenous  Airway Management Planned: Oral ETT  Additional Equipment:   Intra-op Plan:   Post-operative Plan: Extubation in OR  Informed Consent: I have reviewed the patients History and Physical, chart, labs and discussed the procedure including the risks, benefits and alternatives for the proposed anesthesia with the patient or authorized representative who has indicated his/her understanding and acceptance.   Dental advisory given  Plan Discussed with: CRNA and Surgeon  Anesthesia Plan Comments:        Anesthesia Quick Evaluation

## 2014-10-07 NOTE — Anesthesia Procedure Notes (Signed)
Procedure Name: Intubation Date/Time: 10/07/2014 7:41 AM Performed by: Lovie CholOCK, Avik Leoni K Pre-anesthesia Checklist: Patient identified, Emergency Drugs available, Suction available, Patient being monitored and Timeout performed Patient Re-evaluated:Patient Re-evaluated prior to inductionOxygen Delivery Method: Circle system utilized Intubation Type: IV induction and Cricoid Pressure applied Ventilation: Mask ventilation without difficulty Laryngoscope Size: Miller and 2 Grade View: Grade I Tube type: Oral Tube size: 7.0 mm Number of attempts: 1 Airway Equipment and Method: Stylet Placement Confirmation: ETT inserted through vocal cords under direct vision,  positive ETCO2,  CO2 detector and breath sounds checked- equal and bilateral Secured at: 21 cm Tube secured with: Tape Dental Injury: Teeth and Oropharynx as per pre-operative assessment

## 2014-10-08 DIAGNOSIS — M4726 Other spondylosis with radiculopathy, lumbar region: Secondary | ICD-10-CM | POA: Diagnosis not present

## 2014-10-08 MED ORDER — OXYCODONE HCL 5 MG PO TABS: 5.0000 mg | ORAL_TABLET | Freq: Three times a day (TID) | ORAL | Status: DC

## 2014-10-08 MED ORDER — TAPENTADOL HCL 75 MG PO TABS: 75.0000 mg | ORAL_TABLET | Freq: Four times a day (QID) | ORAL | Status: DC | PRN

## 2014-10-08 MED ORDER — DIAZEPAM 5 MG PO TABS: 5.0000 mg | ORAL_TABLET | Freq: Four times a day (QID) | ORAL | Status: AC | PRN

## 2014-10-08 NOTE — Discharge Summary (Signed)
Physician Discharge Summary  Patient ID: Danielle Ford MRN: [id number] DOB/AGE: June 24, 1950 64 y.o.  Admit date: 10/07/2014 Discharge date: 10/08/2014  Admission Diagnoses: Lumbar spondylosis and stenosis L3-L4, lumbar radiculopathy  Discharge Diagnoses: Lumbar spondylosis and stenosis L3-L4, lumbar radiculopathy left greater than right, neurogenic claudication  Active Problems:   Lumbar radiculopathy   Discharged Condition: good  Hospital Course: Patient was admitted to undergo surgical decompression which she tolerated well. She is ambulatory. She is noting less numbness and pain in her left leg. Her incision is clean and dry.  Consults: None  Significant Diagnostic Studies: None  Treatments: surgery: I lateral decompression L3-L4 with laminotomies and foraminotomies.  Discharge Exam: Blood pressure 115/60, pulse 68, temperature 98.3 F (36.8 C), temperature source Oral, resp. rate 16, weight 57.607 kg (127 lb), SpO2 92 %. Incision is clean and dry, motor function is intact. Station and gait are intact.  Disposition: 01-Home or Self Care  Discharge Instructions    Call MD for:  redness, tenderness, or signs of infection (pain, swelling, redness, odor or green/yellow discharge around incision site)    Complete by:  As directed      Call MD for:  severe uncontrolled pain    Complete by:  As directed      Call MD for:  temperature >100.4    Complete by:  As directed      Diet - low sodium heart healthy    Complete by:  As directed      Discharge instructions    Complete by:  As directed   Okay to shower. Do not apply salves or appointments to incision. No heavy lifting with the upper extremities greater than 15 pounds. May resume driving when not requiring pain medication and patient feels comfortable with doing so.     Increase activity slowly    Complete by:  As directed             Medication List    TAKE these medications        amphetamine-dextroamphetamine 30  MG tablet  Commonly known as:  ADDERALL  Take 30 mg by mouth 2 (two) times daily.     b complex vitamins tablet  Take 1 tablet by mouth daily.     Bilberry 80 MG Caps  Take 1 capsule by mouth daily.     CALCIUM + D PO  Take 1 tablet by mouth daily.     diazepam 5 MG tablet  Commonly known as:  VALIUM  Take 1 tablet (5 mg total) by mouth every 6 (six) hours as needed for muscle spasms.     fexofenadine 180 MG tablet  Commonly known as:  ALLEGRA  Take 180 mg by mouth daily.     FLUoxetine 20 MG capsule  Commonly known as:  PROZAC  Take 20 mg by mouth 3 (three) times daily.     GLUCOSAMINE MSM COMPLEX PO  Take 1,500 mg by mouth daily.     lamoTRIgine 200 MG tablet  Commonly known as:  LAMICTAL  Take 200 mg by mouth 2 (two) times daily.     lisinopril 5 MG tablet  Commonly known as:  PRINIVIL,ZESTRIL  Take 5 mg by mouth daily.     montelukast 10 MG tablet  Commonly known as:  SINGULAIR  Take 10 mg by mouth at bedtime.     oxycodone 5 MG capsule  Commonly known as:  OXY-IR  Take 5 mg by mouth 3 (three) times daily.  Tapentadol HCl 75 MG Tabs  Take 1 tablet (75 mg total) by mouth every 6 (six) hours as needed (moderate pain).     traZODone 100 MG tablet  Commonly known as:  DESYREL  Take 100-200 mg by mouth at bedtime.     vitamin A 47829 UNIT capsule  Take 10,000 Units by mouth 2 (two) times daily.     Vitamin D3 5000 UNITS Tabs  Take 1 tablet by mouth 2 (two) times daily.     vitamin E 400 UNIT capsule  Take 400 Units by mouth 3 (three) times daily.         SignedStefani Dama 10/08/2014, 4:16 PM

## 2014-10-08 NOTE — Progress Notes (Signed)
Spoke to PT and patient.  Pt doing well with all adls and all education completed with PT.  No OT needs at this time. Danielle Ford, [place] [phone]

## 2014-10-08 NOTE — Progress Notes (Signed)
Patient ID: [name], female   DOB: [birth year], 64 y.o.   MRN: [id number] Motor function intact , doing well.

## 2014-10-08 NOTE — Progress Notes (Signed)
Physical Therapy Treatment Patient Details Name: Danielle Ford MRN: [id number] DOB: 1951/01/13 Today's Date: 10/08/2014    History of Present Illness Patient is a 64 y/o female s/p L3-4 Laminectomy/Foraminotomy. PMH of HTN, depression, seizures, anxiety and arthritis.    PT Comments    Patient mobilizing well without device. Patient educated on car transfers, safe ambulation, mobility expectations, positional changes, and pain management. Patient able to verablize precautions and demonstrate compliance throughout session. No further acute PT needs at this time. Will sign off.    Follow Up Recommendations  No PT follow up;Supervision - Intermittent     Equipment Recommendations  None recommended by PT    Recommendations for Other Services       Precautions / Restrictions Precautions Precautions: Back Precaution Booklet Issued: Yes (comment) Precaution Comments: Reviewed precautions and handout. Restrictions Weight Bearing Restrictions: No    Mobility  Bed Mobility Overal bed mobility: Needs Assistance Bed Mobility: Rolling;Sidelying to Sit;Sit to Sidelying Rolling: Modified independent (Device/Increase time) Sidelying to sit: Modified independent (Device/Increase time);HOB elevated     Sit to sidelying: Modified independent (Device/Increase time);HOB elevated General bed mobility comments: Use of rail for support. Cues for log roll technique.  Transfers Overall transfer level: Modified independent Equipment used: None   Sit to Stand: Modified independent (Device/Increase time)         General transfer comment: Stood from EOB x1, from toilet x1.   Ambulation/Gait Ambulation/Gait assistance: Modified independent (Device/Increase time) Ambulation Distance (Feet): 220 Feet Assistive device: None Gait Pattern/deviations: Step-through pattern;Decreased dorsiflexion - right;Decreased dorsiflexion - left;Drifts right/left   Gait velocity interpretation: <1.8 ft/sec,  indicative of risk for recurrent falls General Gait Details: Mild instability noted with ambulation, no significant deviations, no noted LOB   Stairs         General stair comments: Supervision for safety. Despite cues for technique, pt utilizing step through pattern.  Wheelchair Mobility    Modified Rankin (Stroke Patients Only)       Balance   Sitting-balance support: Feet supported Sitting balance-Leahy Scale: Good     Standing balance support: During functional activity Standing balance-Leahy Scale: Good                      Cognition Arousal/Alertness: Awake/alert Behavior During Therapy: WFL for tasks assessed/performed Overall Cognitive Status: Within Functional Limits for tasks assessed                      Exercises      General Comments General comments (skin integrity, edema, etc.): Patient educated on car transfers, safe ambulation, mobility expectations, positional changes, and pain management. Patient able to verablize precautions and demonstrate compliance throughout session.      Pertinent Vitals/Pain Pain Assessment: 0-10 Pain Score: 8  Pain Location: back Pain Descriptors / Indicators: Tender Pain Intervention(s): Limited activity within patient's tolerance;Monitored during session;Repositioned    Home Living                      Prior Function            PT Goals (current goals can now be found in the care plan section) Acute Rehab PT Goals Patient Stated Goal: to return to independence PT Goal Formulation: With patient Time For Goal Achievement: 10/21/14 Potential to Achieve Goals: Good Progress towards PT goals: Goals met/education completed, patient discharged from PT    Frequency  Min 5X/week    PT Plan Current plan remains  appropriate    Co-evaluation             End of Session Equipment Utilized During Treatment: Gait belt Activity Tolerance: Patient tolerated treatment well Patient left:  in bed;with call bell/phone within reach;with family/visitor present (sitting EOB)     Time: 5825-1898 PT Time Calculation (min) (ACUTE ONLY): 18 min  Charges:  $Self Care/Home Management: 8-22                    G Codes:  Functional Assessment Tool Used: Clinical judgment Functional Limitation: Mobility: Walking and moving around Mobility: Walking and Moving Around Current Status (M2103): At least 1 percent but less than 20 percent impaired, limited or restricted Mobility: Walking and Moving Around Goal Status [phone]): At least 1 percent but less than 20 percent impaired, limited or restricted Mobility: Walking and Moving Around Discharge Status [phone]): At least 1 percent but less than 20 percent impaired, limited or restricted   Duncan Dull 10/08/2014, 8:46 AM Alben Deeds, PT DPT  [phone]

## 2014-10-08 NOTE — Progress Notes (Signed)
Pt doing well. Pt given D/C instructions with Rx's, verbal understanding was provided. Pt's incision is clean and dry with no sign of infection. Pt's IV was removed prior to D/C. Pt D/C'd home via wheelchair @ 1345 per MD order. Pt is stable @ D/C and has no other needs at this time. Rema FendtAshley Cameron Schwinn, RN

## 2014-10-08 NOTE — Progress Notes (Signed)
UR completed 

## 2014-10-08 NOTE — Progress Notes (Signed)
Physical Therapy Discharge Patient Details Name: Danielle Ford MRN: [id number] DOB: 1950-07-13 Today's Date: 10/08/2014 Time: 4627-0350 PT Time Calculation (min) (ACUTE ONLY): 18 min  Patient discharged from PT services secondary to goals met and no further PT needs identified.  Please see latest therapy progress note for current level of functioning and progress toward goals.    Progress and discharge plan discussed with patient and/or caregiver: Patient/Caregiver agrees with plan  GP Functional Assessment Tool Used: Clinical judgment Functional Limitation: Mobility: Walking and moving around Mobility: Walking and Moving Around Current Status (K9381): At least 1 percent but less than 20 percent impaired, limited or restricted Mobility: Walking and Moving Around Goal Status [phone]): At least 1 percent but less than 20 percent impaired, limited or restricted Mobility: Walking and Moving Around Discharge Status [phone]): At least 1 percent but less than 20 percent impaired, limited or restricted   Rolla Etienne, PT DPT  [phone]  10/08/2014, 8:47 AM

## 2014-10-09 ENCOUNTER — Encounter (HOSPITAL_COMMUNITY): Payer: Self-pay | Admitting: Neurological Surgery

## 2015-04-04 ENCOUNTER — Other Ambulatory Visit: Payer: Self-pay | Admitting: Physician Assistant

## 2015-04-04 NOTE — H&P (Signed)
Olegario MessierKathy comes in with a new issue.  Left ring finger.  Locking going on since December.  Getting worse rather than better.  Previous x-rays showing some Heberden nodes, degenerative changes.   Remaining history is reviewed, updated and included in the chart.  Of note, I have gone over her evaluation for her back.  She is going to be retiring in February has operative intervention planned on her lumbar spine then.     EXAMINATION: General exam is outlined and included in the chart.  Specifically, obvious triggering A1 pulley, ring finger, left hand.  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  No other deformities.  Neurovascularly intact.    IMPRESSION: Triggering A1 pulley, left ring finger.    PLAN: We discussed options.  We are going to try an injection today.  If this fails I have gone over what is involved for A1 pulley release.  She will call and let me know.    PROCEDURE NOTE: The patient's clinical condition is marked by substantial pain and/or significant functional disability.  Other conservative therapy has not provided relief, is contraindicated, or not appropriate.  There is a reasonable likelihood that injection will significantly improve the patient's pain and/or functional disability. After appropriate consent and under sterile technique A1 pulley injected with Depo-Medrol/Marcaine, left ring finger.  Tolerated this well.       Loreta Aveaniel F. Murphy, M.D.  Addendum: Pain has returned following injection and patient would like to proceed with surgical intervention.  We will proceed with A-1 pulley release left ring finger.  Risks, benefits and possible complications reviewed.  Rehab and recovery time reviewed.  All questions answered.  Paperwork complete.

## 2015-04-07 ENCOUNTER — Ambulatory Visit (HOSPITAL_BASED_OUTPATIENT_CLINIC_OR_DEPARTMENT_OTHER): Payer: 59 | Admitting: Anesthesiology

## 2015-04-07 ENCOUNTER — Encounter (HOSPITAL_BASED_OUTPATIENT_CLINIC_OR_DEPARTMENT_OTHER): Payer: Self-pay

## 2015-04-07 ENCOUNTER — Encounter (HOSPITAL_BASED_OUTPATIENT_CLINIC_OR_DEPARTMENT_OTHER)
Admission: RE | Disposition: A | Discharge status: Home / Self Care | Payer: Self-pay | Source: Ambulatory Visit | Attending: Orthopedic Surgery

## 2015-04-07 ENCOUNTER — Ambulatory Visit (HOSPITAL_BASED_OUTPATIENT_CLINIC_OR_DEPARTMENT_OTHER)
Admission: RE | Admit: 2015-04-07 | Discharge: 2015-04-07 | Disposition: A | Discharge status: Home / Self Care | Payer: 59 | Source: Ambulatory Visit | Attending: Orthopedic Surgery | Admitting: Orthopedic Surgery

## 2015-04-07 DIAGNOSIS — I10 Essential (primary) hypertension: Secondary | ICD-10-CM | POA: Diagnosis not present

## 2015-04-07 DIAGNOSIS — Z79899 Other long term (current) drug therapy: Secondary | ICD-10-CM | POA: Diagnosis not present

## 2015-04-07 DIAGNOSIS — F329 Major depressive disorder, single episode, unspecified: Secondary | ICD-10-CM | POA: Insufficient documentation

## 2015-04-07 DIAGNOSIS — M65342 Trigger finger, left ring finger: Secondary | ICD-10-CM | POA: Diagnosis present

## 2015-04-07 DIAGNOSIS — M1990 Unspecified osteoarthritis, unspecified site: Secondary | ICD-10-CM | POA: Diagnosis not present

## 2015-04-07 DIAGNOSIS — F419 Anxiety disorder, unspecified: Secondary | ICD-10-CM | POA: Insufficient documentation

## 2015-04-07 HISTORY — PX: TRIGGER FINGER RELEASE: SHX641

## 2015-04-07 SURGERY — RELEASE, A1 PULLEY, FOR TRIGGER FINGER
Anesthesia: General | Site: Hand | Laterality: Left

## 2015-04-07 MED ORDER — ONDANSETRON HCL 4 MG/2ML IJ SOLN
INTRAMUSCULAR | Status: DC | PRN
  Administered 2015-04-07: 4 mg via INTRAVENOUS

## 2015-04-07 MED ORDER — HYDROMORPHONE HCL 1 MG/ML IJ SOLN: 0.2500 mg | INTRAMUSCULAR | Status: DC | PRN

## 2015-04-07 MED ORDER — KETOROLAC TROMETHAMINE 30 MG/ML IJ SOLN: 30.0000 mg | Freq: Once | INTRAMUSCULAR | Status: DC

## 2015-04-07 MED ORDER — OXYCODONE-ACETAMINOPHEN 5-325 MG PO TABS: 1.0000 | ORAL_TABLET | ORAL | Status: AC | PRN

## 2015-04-07 MED ORDER — FENTANYL CITRATE (PF) 100 MCG/2ML IJ SOLN
INTRAMUSCULAR | Status: AC
  Filled 2015-04-07: qty 4

## 2015-04-07 MED ORDER — BUPIVACAINE HCL (PF) 0.25 % IJ SOLN
INTRAMUSCULAR | Status: AC
  Filled 2015-04-07: qty 30

## 2015-04-07 MED ORDER — DEXAMETHASONE SODIUM PHOSPHATE 4 MG/ML IJ SOLN
INTRAMUSCULAR | Status: DC | PRN
  Administered 2015-04-07: 10 mg via INTRAVENOUS

## 2015-04-07 MED ORDER — ONDANSETRON HCL 4 MG PO TABS: 4.0000 mg | ORAL_TABLET | Freq: Three times a day (TID) | ORAL | Status: AC | PRN

## 2015-04-07 MED ORDER — BUPIVACAINE HCL (PF) 0.5 % IJ SOLN
INTRAMUSCULAR | Status: DC | PRN
Start: 2015-04-07 — End: 2015-04-07
  Administered 2015-04-07: 10 mL

## 2015-04-07 MED ORDER — LIDOCAINE HCL (CARDIAC) 20 MG/ML IV SOLN
INTRAVENOUS | Status: AC
  Filled 2015-04-07: qty 5

## 2015-04-07 MED ORDER — FENTANYL CITRATE (PF) 100 MCG/2ML IJ SOLN
INTRAMUSCULAR | Status: DC | PRN
  Administered 2015-04-07: 100 ug via INTRAVENOUS

## 2015-04-07 MED ORDER — LIDOCAINE HCL (CARDIAC) 20 MG/ML IV SOLN
INTRAVENOUS | Status: DC | PRN
  Administered 2015-04-07: 30 mg via INTRAVENOUS

## 2015-04-07 MED ORDER — SUCCINYLCHOLINE CHLORIDE 20 MG/ML IJ SOLN
INTRAMUSCULAR | Status: AC
  Filled 2015-04-07: qty 1

## 2015-04-07 MED ORDER — DEXAMETHASONE SODIUM PHOSPHATE 10 MG/ML IJ SOLN
INTRAMUSCULAR | Status: AC
  Filled 2015-04-07: qty 1

## 2015-04-07 MED ORDER — LACTATED RINGERS IV SOLN
INTRAVENOUS | Status: DC
  Administered 2015-04-07: 09:00:00 via INTRAVENOUS

## 2015-04-07 MED ORDER — OXYCODONE HCL 5 MG/5ML PO SOLN: 5.0000 mg | Freq: Once | ORAL | Status: DC | PRN

## 2015-04-07 MED ORDER — MIDAZOLAM HCL 5 MG/5ML IJ SOLN
INTRAMUSCULAR | Status: DC | PRN
  Administered 2015-04-07: 2 mg via INTRAVENOUS

## 2015-04-07 MED ORDER — PROPOFOL 10 MG/ML IV BOLUS
INTRAVENOUS | Status: DC | PRN
  Administered 2015-04-07: 200 mg via INTRAVENOUS

## 2015-04-07 MED ORDER — PROMETHAZINE HCL 25 MG/ML IJ SOLN
6.2500 mg | INTRAMUSCULAR | Status: DC | PRN
Start: 2015-04-07 — End: 2015-04-07

## 2015-04-07 MED ORDER — BUPIVACAINE HCL (PF) 0.5 % IJ SOLN
INTRAMUSCULAR | Status: AC
  Filled 2015-04-07: qty 30

## 2015-04-07 MED ORDER — ONDANSETRON HCL 4 MG/2ML IJ SOLN
INTRAMUSCULAR | Status: AC
  Filled 2015-04-07: qty 2

## 2015-04-07 MED ORDER — CEFAZOLIN SODIUM-DEXTROSE 2-3 GM-% IV SOLR
2.0000 g | INTRAVENOUS | Status: AC
Start: 2015-04-07 — End: 2015-04-07
  Administered 2015-04-07: 2 g via INTRAVENOUS

## 2015-04-07 MED ORDER — OXYCODONE HCL 5 MG PO TABS: 5.0000 mg | ORAL_TABLET | Freq: Once | ORAL | Status: DC | PRN

## 2015-04-07 MED ORDER — CHLORHEXIDINE GLUCONATE 4 % EX LIQD: 60.0000 mL | Freq: Once | CUTANEOUS | Status: DC

## 2015-04-07 MED ORDER — MIDAZOLAM HCL 2 MG/2ML IJ SOLN
INTRAMUSCULAR | Status: AC
  Filled 2015-04-07: qty 4

## 2015-04-07 SURGICAL SUPPLY — 45 items
BANDAGE ELASTIC 3 VELCRO ST LF (GAUZE/BANDAGES/DRESSINGS) ×3 IMPLANT
BLADE SURG 15 STRL LF DISP TIS (BLADE) ×1 IMPLANT
BLADE SURG 15 STRL SS (BLADE) ×3
BNDG CMPR 9X4 STRL LF SNTH (GAUZE/BANDAGES/DRESSINGS) ×1
BNDG COHESIVE 3X5 TAN STRL LF (GAUZE/BANDAGES/DRESSINGS) ×3 IMPLANT
BNDG ESMARK 4X9 LF (GAUZE/BANDAGES/DRESSINGS) ×2 IMPLANT
CORDS BIPOLAR (ELECTRODE) IMPLANT
COVER BACK TABLE 60X90IN (DRAPES) ×3 IMPLANT
COVER MAYO STAND STRL (DRAPES) ×3 IMPLANT
CUFF TOURNIQUET SINGLE 18IN (TOURNIQUET CUFF) ×2 IMPLANT
DRAPE EXTREMITY T 121X128X90 (DRAPE) ×3 IMPLANT
DRAPE SURG 17X23 STRL (DRAPES) ×3 IMPLANT
DRSG PAD ABDOMINAL 8X10 ST (GAUZE/BANDAGES/DRESSINGS) ×1 IMPLANT
DURAPREP 26ML APPLICATOR (WOUND CARE) ×3 IMPLANT
GAUZE SPONGE 4X4 12PLY STRL (GAUZE/BANDAGES/DRESSINGS) ×3 IMPLANT
GAUZE XEROFORM 1X8 LF (GAUZE/BANDAGES/DRESSINGS) ×3 IMPLANT
GLOVE BIOGEL PI IND STRL 7.0 (GLOVE) ×1 IMPLANT
GLOVE BIOGEL PI INDICATOR 7.0 (GLOVE) ×4
GLOVE ECLIPSE 6.5 STRL STRAW (GLOVE) ×2 IMPLANT
GLOVE ECLIPSE 7.0 STRL STRAW (GLOVE) ×3 IMPLANT
GLOVE SURG ORTHO 8.0 STRL STRW (GLOVE) ×3 IMPLANT
GOWN STRL REUS W/ TWL LRG LVL3 (GOWN DISPOSABLE) ×2 IMPLANT
GOWN STRL REUS W/ TWL XL LVL3 (GOWN DISPOSABLE) ×1 IMPLANT
GOWN STRL REUS W/TWL LRG LVL3 (GOWN DISPOSABLE) ×6
GOWN STRL REUS W/TWL XL LVL3 (GOWN DISPOSABLE) ×3
NDL HYPO 25X1 1.5 SAFETY (NEEDLE) ×1 IMPLANT
NEEDLE HYPO 25X1 1.5 SAFETY (NEEDLE) ×3 IMPLANT
NS IRRIG 1000ML POUR BTL (IV SOLUTION) ×3 IMPLANT
PACK BASIN DAY SURGERY FS (CUSTOM PROCEDURE TRAY) ×3 IMPLANT
PAD CAST 3X4 CTTN HI CHSV (CAST SUPPLIES) ×2 IMPLANT
PADDING CAST ABS 3INX4YD NS (CAST SUPPLIES)
PADDING CAST ABS 4INX4YD NS (CAST SUPPLIES) ×2
PADDING CAST ABS COTTON 3X4 (CAST SUPPLIES) ×1 IMPLANT
PADDING CAST ABS COTTON 4X4 ST (CAST SUPPLIES) ×1 IMPLANT
PADDING CAST COTTON 3X4 STRL (CAST SUPPLIES) ×3
SPLINT FIBERGLASS 3X35 (CAST SUPPLIES) ×1 IMPLANT
SPLINT PLASTER CAST XFAST 3X15 (CAST SUPPLIES) IMPLANT
SPLINT PLASTER XTRA FASTSET 3X (CAST SUPPLIES)
STOCKINETTE 4X48 STRL (DRAPES) ×3 IMPLANT
SUT ETHILON 3 0 PS 1 (SUTURE) ×1 IMPLANT
SUT MNCRL AB 3-0 PS2 18 (SUTURE) ×2 IMPLANT
SYR BULB 3OZ (MISCELLANEOUS) ×3 IMPLANT
SYR CONTROL 10ML LL (SYRINGE) ×3 IMPLANT
TOWEL OR 17X24 6PK STRL BLUE (TOWEL DISPOSABLE) ×3 IMPLANT
UNDERPAD 30X30 (UNDERPADS AND DIAPERS) ×3 IMPLANT

## 2015-04-07 NOTE — Anesthesia Postprocedure Evaluation (Signed)
  Anesthesia Post-op Note  Patient: Ferd GlassingKathy A Shipper  Procedure(s) Performed: Procedure(s): RELEASE TRIGGER FINGER/A-1 PULLEY LEFT RING FINGER (Left)  Patient Location: PACU  Anesthesia Type:General  Level of Consciousness: awake and alert   Airway and Oxygen Therapy: Patient Spontanous Breathing  Post-op Pain: none  Post-op Assessment: Post-op Vital signs reviewed              Post-op Vital Signs: Reviewed  Last Vitals:  Filed Vitals:   04/07/15 1213  BP: 152/75  Pulse: 76  Temp: 36.9 C  Resp: 22    Complications: No apparent anesthesia complications

## 2015-04-07 NOTE — Interval H&P Note (Signed)
History and Physical Interval Note:  04/07/2015 10:30 AM  Danielle Ford  has presented today for surgery, with the diagnosis of TRIGGER FINGER,LEFT RING FINGER  The various methods of treatment have been discussed with the patient and family. After consideration of risks, benefits and other options for treatment, the patient has consented to  Procedure(s): RELEASE TRIGGER FINGER/A-1 PULLEY LEFT RING FINGER (Left) as a surgical intervention .  The patient's history has been reviewed, patient examined, no change in status, stable for surgery.  I have reviewed the patient's chart and labs.  Questions were answered to the patient's satisfaction.     Loreta Aveaniel F Trevan Messman

## 2015-04-07 NOTE — Anesthesia Preprocedure Evaluation (Addendum)
Anesthesia Evaluation  Patient identified by MRN, date of birth, ID band Patient awake    Reviewed: Allergy & Precautions, NPO status , Patient's Chart, lab work & pertinent test results  Airway Mallampati: II  TM Distance: >3 FB Neck ROM: Full    Dental   Pulmonary neg pulmonary ROS,    breath sounds clear to auscultation       Cardiovascular hypertension, Pt. on medications  Rhythm:Regular Rate:Normal     Neuro/Psych Seizures -,  Anxiety Depression    GI/Hepatic negative GI ROS, Neg liver ROS,   Endo/Other  negative endocrine ROS  Renal/GU negative Renal ROS     Musculoskeletal  (+) Arthritis ,   Abdominal   Peds  Hematology negative hematology ROS (+)   Anesthesia Other Findings   Reproductive/Obstetrics                            Anesthesia Physical Anesthesia Plan  ASA: II  Anesthesia Plan: General   Post-op Pain Management:    Induction: Intravenous  Airway Management Planned: LMA  Additional Equipment:   Intra-op Plan:   Post-operative Plan:   Informed Consent: I have reviewed the patients History and Physical, chart, labs and discussed the procedure including the risks, benefits and alternatives for the proposed anesthesia with the patient or authorized representative who has indicated his/her understanding and acceptance.     Plan Discussed with: CRNA  Anesthesia Plan Comments:        Anesthesia Quick Evaluation

## 2015-04-07 NOTE — Discharge Instructions (Signed)
Do not remove bandage.  Do not get bandage wet.  May apply ice for up to 20 minutes at a time for pain and swelling.  Follow up appointment in one week.  ° °SEEK MEDICAL CARE IF: °You have swelling of your calf or leg.  °You develop shortness of breath or chest pain.  °You have redness, swelling, or increasing pain in the wound.  °There is pus or any unusual drainage coming from the surgical site.  °You notice a bad smell coming from the surgical site or dressing.  °The surgical site breaks open after sutures or staples have been removed.  °There is persistent bleeding from the suture or staple line.  °You are getting worse or are not improving.  °You have any other questions or concerns.  °SEEK IMMEDIATE MEDICAL CARE IF:  °You have a fever.  °You develop a rash.  °You have difficulty breathing.  °You develop any reaction or side effects to medicines given.  °Your knee motion is decreasing rather than improving.  °MAKE SURE YOU:  °Understand these instructions.  °Will watch your condition.  °Will get help right away if you are not doing well or get worse.  ° ° °Post Anesthesia Home Care Instructions ° °Activity: °Get plenty of rest for the remainder of the day. A responsible adult should stay with you for 24 hours following the procedure.  °For the next 24 hours, DO NOT: °-Drive a car °-Operate machinery °-Drink alcoholic beverages °-Take any medication unless instructed by your physician °-Make any legal decisions or sign important papers. ° °Meals: °Start with liquid foods such as gelatin or soup. Progress to regular foods as tolerated. Avoid greasy, spicy, heavy foods. If nausea and/or vomiting occur, drink only clear liquids until the nausea and/or vomiting subsides. Call your physician if vomiting continues. ° °Special Instructions/Symptoms: °Your throat may feel dry or sore from the anesthesia or the breathing tube placed in your throat during surgery. If this causes discomfort, gargle with warm salt water.  The discomfort should disappear within 24 hours. ° °If you had a scopolamine patch placed behind your ear for the management of post- operative nausea and/or vomiting: ° °1. The medication in the patch is effective for 72 hours, after which it should be removed.  Wrap patch in a tissue and discard in the trash. Wash hands thoroughly with soap and water. °2. You may remove the patch earlier than 72 hours if you experience unpleasant side effects which may include dry mouth, dizziness or visual disturbances. °3. Avoid touching the patch. Wash your hands with soap and water after contact with the patch. °  ° °

## 2015-04-07 NOTE — Transfer of Care (Signed)
Immediate Anesthesia Transfer of Care Note  Patient: Danielle Ford  Procedure(s) Performed: Procedure(s): RELEASE TRIGGER FINGER/A-1 PULLEY LEFT RING FINGER (Left)  Patient Location: PACU  Anesthesia Type:General  Level of Consciousness: awake, sedated and patient cooperative  Airway & Oxygen Therapy: Patient Spontanous Breathing and Patient connected to face mask oxygen  Post-op Assessment: Report given to RN and Post -op Vital signs reviewed and stable  Post vital signs: Reviewed and stable  Last Vitals:  Filed Vitals:   04/07/15 0844  BP: 147/64  Pulse: 76  Temp: 36.5 C  Resp: 16    Complications: No apparent anesthesia complications

## 2015-04-07 NOTE — Anesthesia Procedure Notes (Signed)
Procedure Name: LMA Insertion Date/Time: 04/07/2015 10:38 AM Performed by: Gar GibbonKEETON, Creg Gilmer S Pre-anesthesia Checklist: Patient identified, Emergency Drugs available, Suction available and Patient being monitored Patient Re-evaluated:Patient Re-evaluated prior to inductionOxygen Delivery Method: Circle System Utilized Preoxygenation: Pre-oxygenation with 100% oxygen Intubation Type: IV induction Ventilation: Mask ventilation without difficulty LMA: LMA inserted LMA Size: 4.0 Number of attempts: 1 Airway Equipment and Method: Bite block Placement Confirmation: positive ETCO2 Tube secured with: Tape Dental Injury: Teeth and Oropharynx as per pre-operative assessment

## 2015-04-08 ENCOUNTER — Encounter (HOSPITAL_BASED_OUTPATIENT_CLINIC_OR_DEPARTMENT_OTHER): Payer: Self-pay | Admitting: Orthopedic Surgery

## 2015-04-08 NOTE — Op Note (Signed)
NAMVesta Mixer:  Ford, Danielle Ford                ACCOUNT NO.:  [id number]  MEDICAL RECORD NO.:  [id number]  LOCATION:                               FACILITY:  [facility]  PHYSICIAN:  Loreta Aveaniel F. Mylen Mangan, M.D. DATE OF BIRTH:  09-19-1950  DATE OF PROCEDURE:  04/07/2015 DATE OF DISCHARGE:  04/07/2015                              OPERATIVE REPORT   PREOPERATIVE DIAGNOSIS:  Symptomatic triggering A1 pulley, left ring finger.  POSTOPERATIVE DIAGNOSIS:  Symptomatic triggering A1 pulley, left ring finger.  PROCEDURE:  Release of A1 pulley, left ring finger.  SURGEON:  Loreta Aveaniel F. Kohner Orlick, M.D.  ASSISTANT:  Mikey KirschnerLindsey Stanberry, PA  ANESTHESIA:  General.  BLOOD LOSS:  Minimal.  SPECIMENS:  None.  CULTURES:  None.  COMPLICATIONS:  None.  DRESSINGS:  Soft compressive.  TOURNIQUET TIME:  25 minutes.  DESCRIPTION OF PROCEDURE:  The patient was brought to the operating room, placed on the operating table in a supine position.  After adequate anesthesia had been obtained, tourniquet applied.  Prepped and draped in usual sterile fashion.  Exsanguinated with elevation of Esmarch.  Tourniquet inflated to 250 mmHg.  A small transverse incision over the A1 pulley, ring finger.  Skin and subcutaneous tissue divided. Neurovascular structures identified, protected, retracted.  A1 pulley released in its entirety proximally to distally.  Some attrition along the tendon, but intact.  Moderate amount of fluid within the tendon sheaths.  Wound irrigated.  Skin closed with interrupted suture.  Margins were injected with Marcaine.  Sterile compressive dressing applied.  Tourniquet deflated and removed.  Anesthesia reversed.  Brought to the recovery room.  Tolerated the surgery well.  No complications.     Loreta Aveaniel F. Alexio Sroka, M.D.     DFM/MEDQ  D:  04/07/2015  T:  04/07/2015  Job:  [id number]

## 2015-04-24 ENCOUNTER — Encounter: Payer: Self-pay | Admitting: Gastroenterology

## 2015-06-06 ENCOUNTER — Telehealth: Payer: Self-pay | Admitting: *Deleted

## 2015-06-06 NOTE — Telephone Encounter (Signed)
Tried to call pt about missed pre-visit appointment, message says number has been disconnected or changed, verified number on account, appointments cancelled and no show letter sent-adm

## 2015-06-20 ENCOUNTER — Encounter: Payer: 59 | Admitting: Gastroenterology

## 2015-10-05 IMAGING — CR DG LUMBAR SPINE 2-3V
1 series · 1 of 1 positions shown · non-contrast
Comparison: MRI 04/26/2014.

CLINICAL DATA: Laminectomy.

EXAM:
LUMBAR SPINE - 2-3 VIEW

[lat]
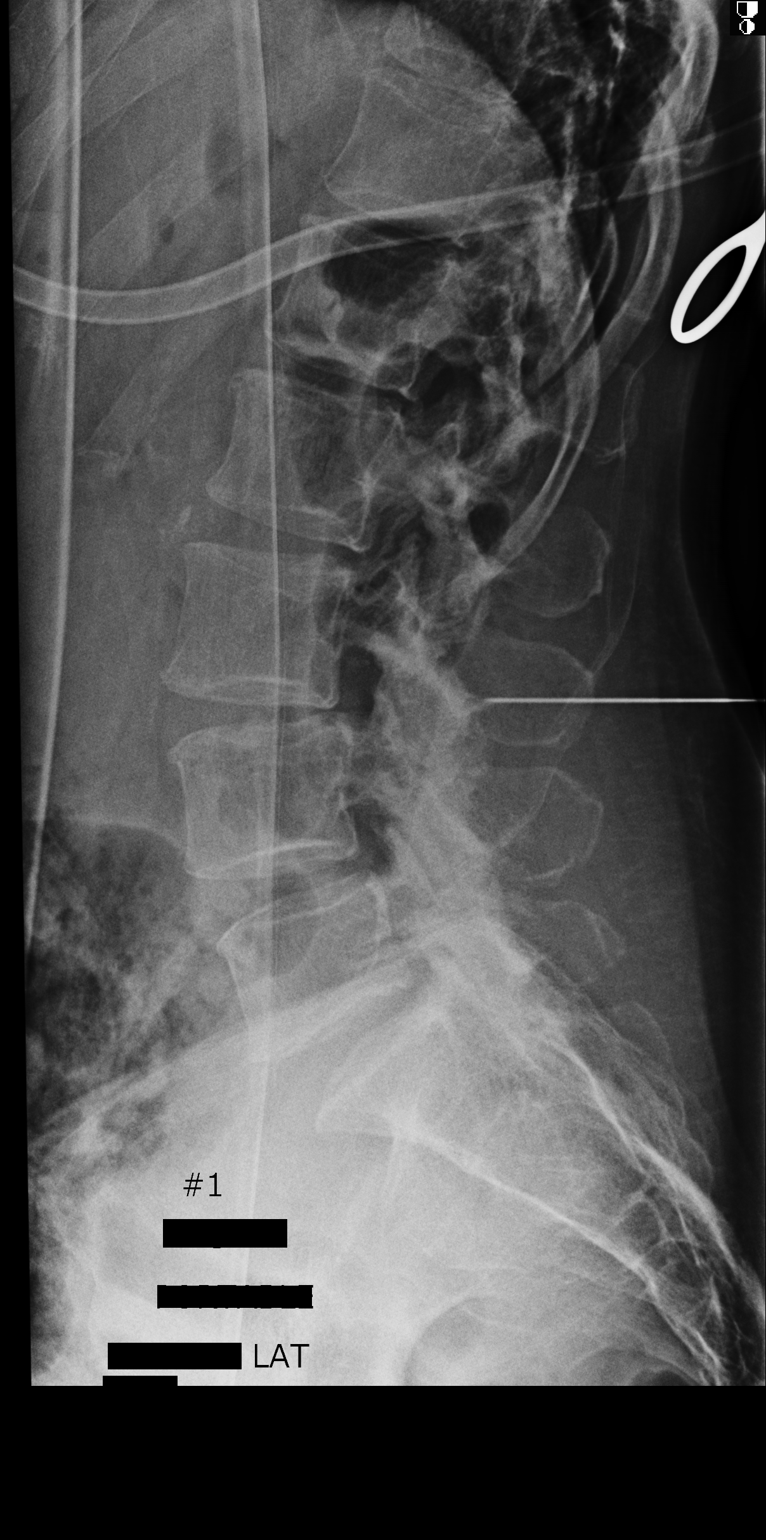

[1 of 1 positions shown; findings below may reference images not displayed]

FINDINGS: Lumbar vertebra are numbered as per prior MRI. Metallic marker noted
along the posterior aspect of the lumbar spine at the L3-L4 level.
IMPRESSION: Metallic marker noted along the posterior aspect of the lumbar spine
at the L3-L4 level.

## 2016-04-05 ENCOUNTER — Other Ambulatory Visit: Payer: Self-pay | Admitting: Obstetrics and Gynecology

## 2016-04-05 DIAGNOSIS — R928 Other abnormal and inconclusive findings on diagnostic imaging of breast: Secondary | ICD-10-CM

## 2016-07-05 ENCOUNTER — Other Ambulatory Visit: Payer: Self-pay | Admitting: Pain Medicine

## 2016-08-03 ENCOUNTER — Other Ambulatory Visit: Payer: Self-pay | Admitting: Pain Medicine

## 2016-08-31 ENCOUNTER — Other Ambulatory Visit: Payer: Self-pay | Admitting: Pain Medicine
